# Patient Record
Sex: Male | Born: 1995 | Race: Black or African American | Hispanic: No | Marital: Single | Smoking: Never smoker
Health system: Southern US, Community
[De-identification: ages and names within clinical notes are randomized; demographics above are authoritative.]

---

## 2015-06-26 ENCOUNTER — Ambulatory Visit
Admission: RE | Admit: 2015-06-26 | Discharge: 2015-06-26 | Disposition: A | Discharge status: Home / Self Care | Payer: Federal, State, Local not specified - PPO | Source: Ambulatory Visit | Attending: Family Medicine | Admitting: Family Medicine

## 2015-06-26 ENCOUNTER — Encounter: Payer: Self-pay | Admitting: Family Medicine

## 2015-06-26 ENCOUNTER — Ambulatory Visit (INDEPENDENT_AMBULATORY_CARE_PROVIDER_SITE_OTHER): Payer: Federal, State, Local not specified - PPO | Admitting: Family Medicine

## 2015-06-26 VITALS — BP 122/72 | Ht 67.0 in | Wt 175.0 lb

## 2015-06-26 DIAGNOSIS — S20212A Contusion of left front wall of thorax, initial encounter: Secondary | ICD-10-CM | POA: Diagnosis not present

## 2015-06-26 DIAGNOSIS — R0781 Pleurodynia: Secondary | ICD-10-CM | POA: Diagnosis not present

## 2015-06-26 DIAGNOSIS — S20219A Contusion of unspecified front wall of thorax, initial encounter: Secondary | ICD-10-CM | POA: Insufficient documentation

## 2015-06-26 MED ORDER — MELOXICAM 15 MG PO TABS: ORAL_TABLET | ORAL | Status: AC

## 2015-06-26 NOTE — Progress Notes (Signed)
  Chad Wallace - 19 y.o. male MRN [id number]  Date of birth: [birth year] [name] is a 19 y.o. male who presents today for L rib injury.  L rib injury - Pt was cleated in anterior left ribs during practice this AM.  Having some pain in the area with movement but denies frank SOB, radiating pain, or increased rib pain.  No previous injuries to the area.  No step off deformity.  Was wearing shoulder pads and was right below this in the L rib area.   PMHx - Updated and reviewed.  Contributory factors include: Non contributory PSHx - Updated and reviewed.  Contributory factors include:  Non Contributory  FHx - Updated and reviewed.  Contributory factors include:  Non contributory  Medications - None   ROS Per HPI   Exam:  Filed Vitals:   06/26/15 1408  BP: 122/72   Gen: NAD Cardiorespiratory - Normal respiratory effort/rate.  RRR Chest Wall: TTP anterior costal margins around Rib 7-8-9.  No manubrium/xiphoid TTP.  Normal Respirations and effort.  RRR.    Imaging:  Chest wall x-rays pending.

## 2015-06-26 NOTE — Assessment & Plan Note (Signed)
Rib contusion vs possible small rib fx vs possible costochondritis - X-ray L rib cage - Mobic, Ice, protective care - F/U as needed

## 2017-03-28 IMAGING — CR DG RIBS W/ CHEST 3+V*L*
5 series · 5 of 5 positions shown · non-contrast
Comparison: None.

CLINICAL DATA: Fall today with left anterior chest pain, initial
encounter

EXAM:
LEFT RIBS AND CHEST - 3+ VIEW

[w chest pa]
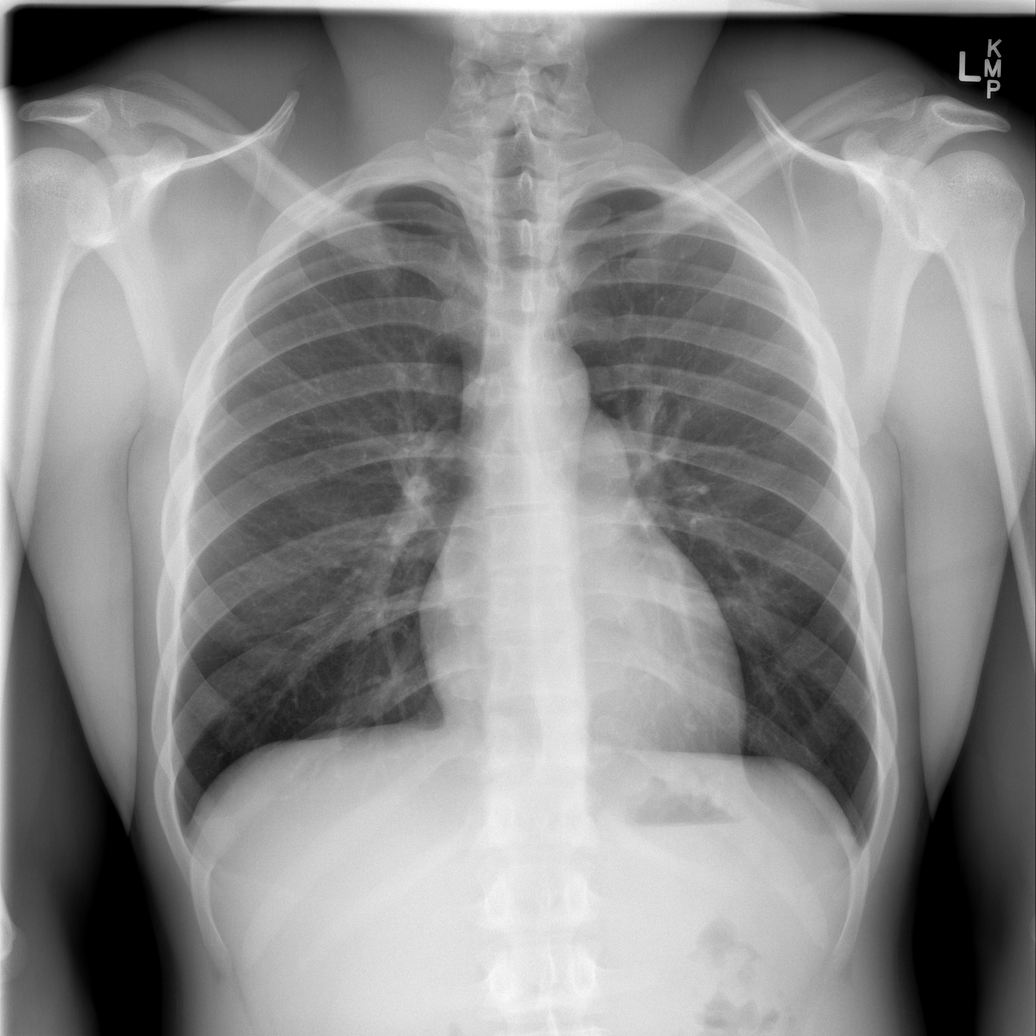

[w ribs ap/pa upper left]
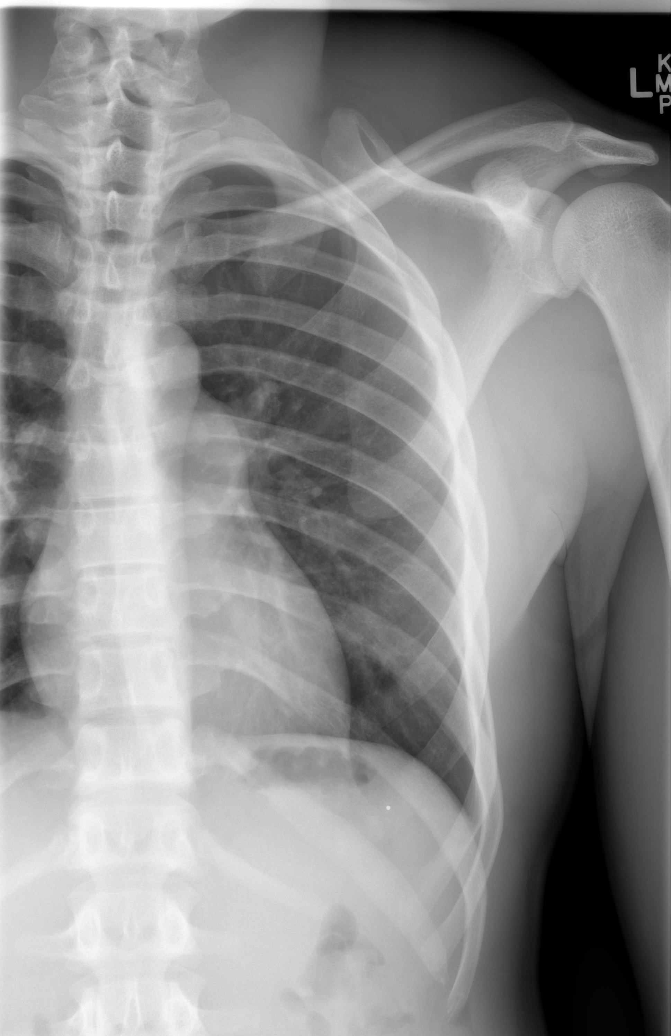

[w ribs ap/pa lower left]
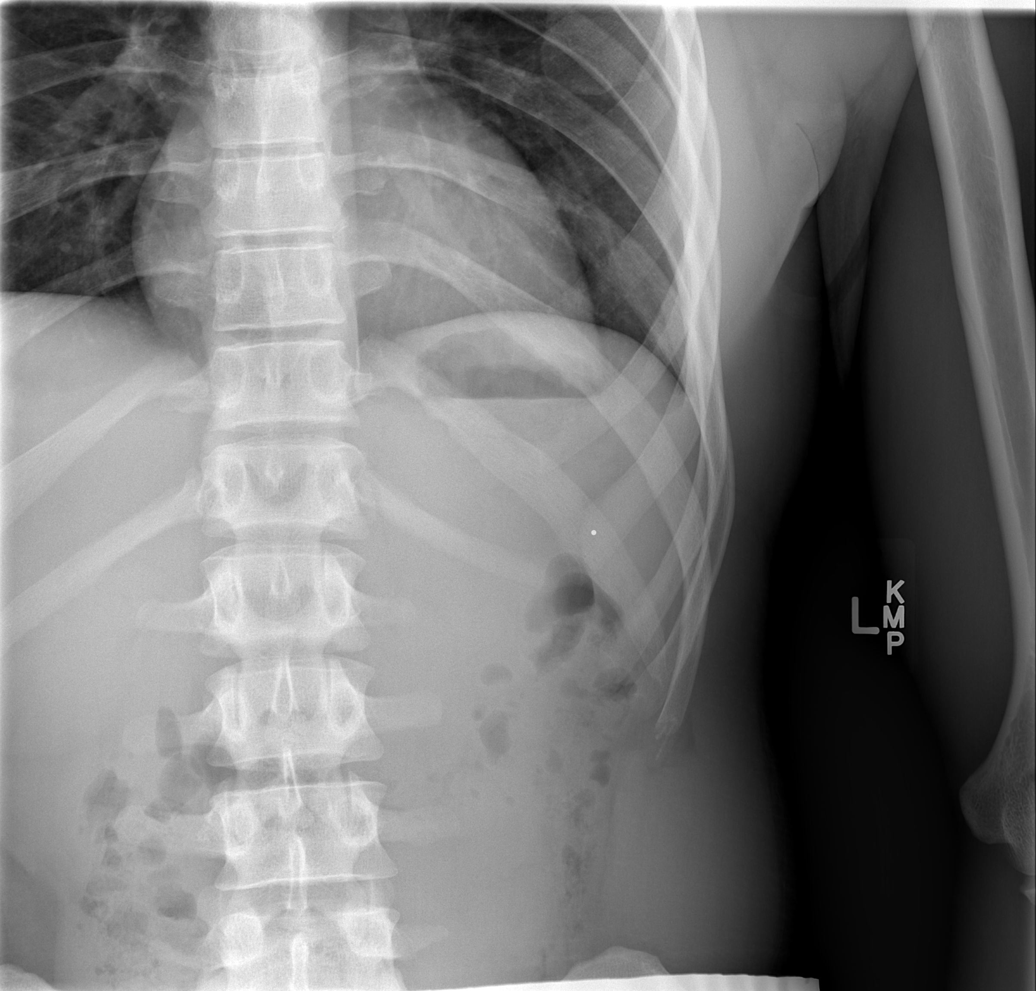

[w ribs oblique left (1 of 2)]
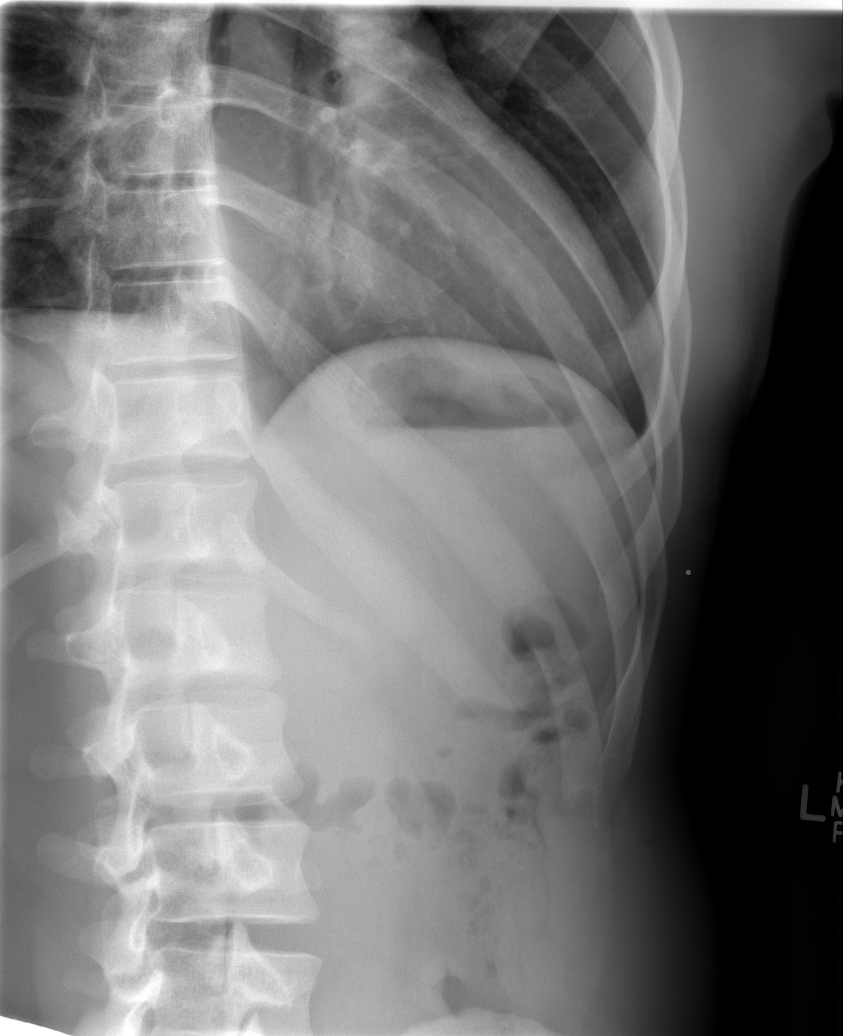

[w ribs oblique left (2 of 2)]
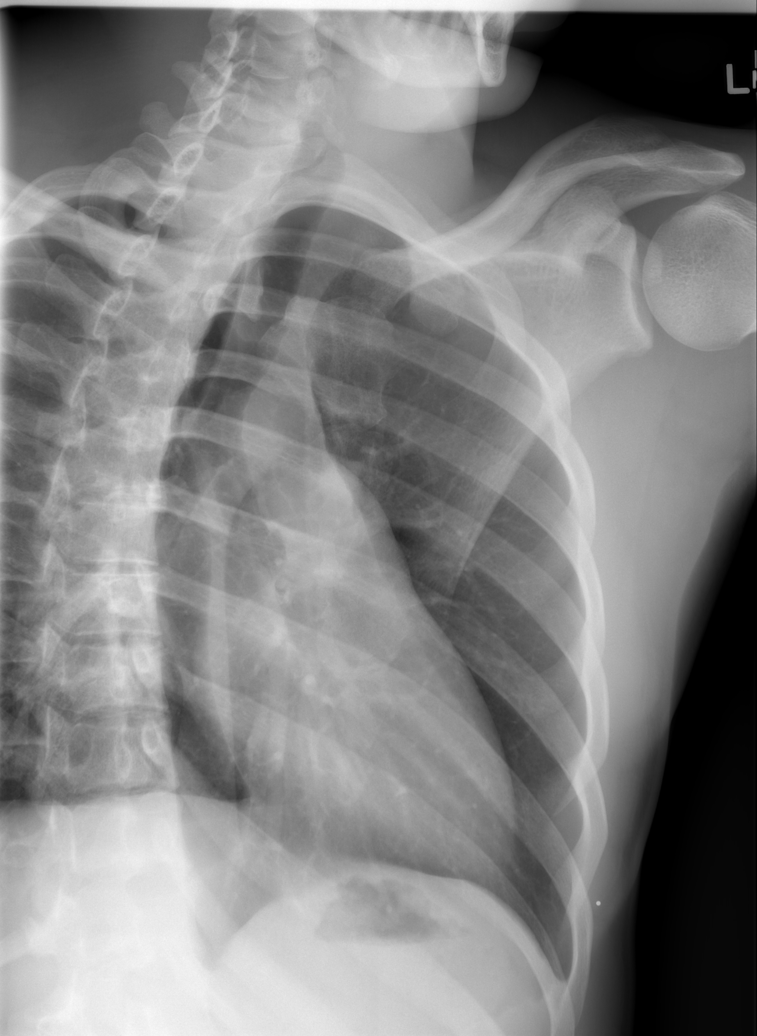

[5 of 5 positions shown; findings below may reference images not displayed]

FINDINGS: No fracture or other bone lesions are seen involving the ribs. There
is no evidence of pneumothorax or pleural effusion. Both lungs are
clear. Heart size and mediastinal contours are within normal limits.
IMPRESSION: No acute abnormality noted.

## 2022-05-21 DIAGNOSIS — Z6825 Body mass index (BMI) 25.0-25.9, adult: Secondary | ICD-10-CM | POA: Diagnosis not present

## 2022-05-21 DIAGNOSIS — Z0000 Encounter for general adult medical examination without abnormal findings: Secondary | ICD-10-CM | POA: Diagnosis not present

## 2022-05-21 DIAGNOSIS — Z23 Encounter for immunization: Secondary | ICD-10-CM | POA: Diagnosis not present

## 2022-05-21 DIAGNOSIS — Z111 Encounter for screening for respiratory tuberculosis: Secondary | ICD-10-CM | POA: Diagnosis not present

## 2022-05-21 DIAGNOSIS — Z13220 Encounter for screening for lipoid disorders: Secondary | ICD-10-CM | POA: Diagnosis not present

## 2022-05-21 DIAGNOSIS — Z1331 Encounter for screening for depression: Secondary | ICD-10-CM | POA: Diagnosis not present
